# Patient Record
Sex: Male | Born: 1990
Health system: Southern US, Community
[De-identification: ages and names within clinical notes are randomized; demographics above are authoritative.]

---
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
## 2010-05-04 IMAGING — US US ABDOMEN COMPLETE
1 series · 14 of 25 positions shown · non-contrast
Comparison: 08/21/2006 renal ultrasound

CLINICAL DATA: Abnormal liver function tests.

ABDOMEN ULTRASOUND
TECHNIQUE: Complete abdominal ultrasound examination was performed
including evaluation of the liver, gallbladder, bile ducts,
pancreas, kidneys, spleen, IVC, and abdominal aorta.

[Series 1: us abdomen complete · 0.26mm/px · 14 of 66 slices shown]
[im 1/66]
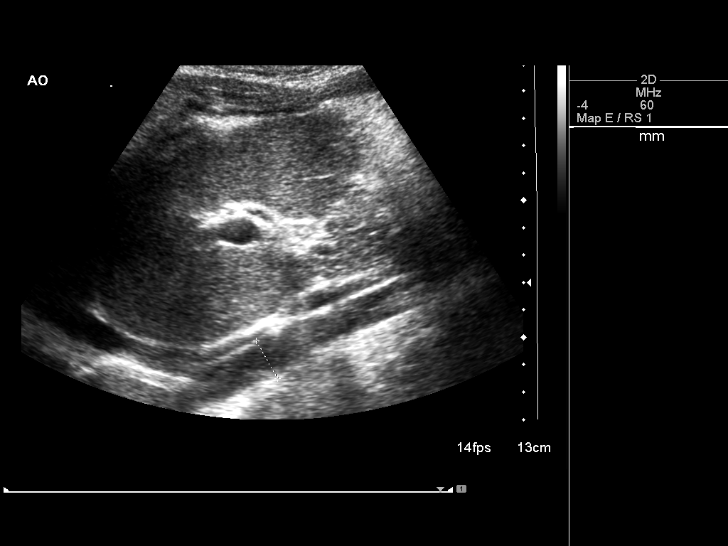
[im 6/66]
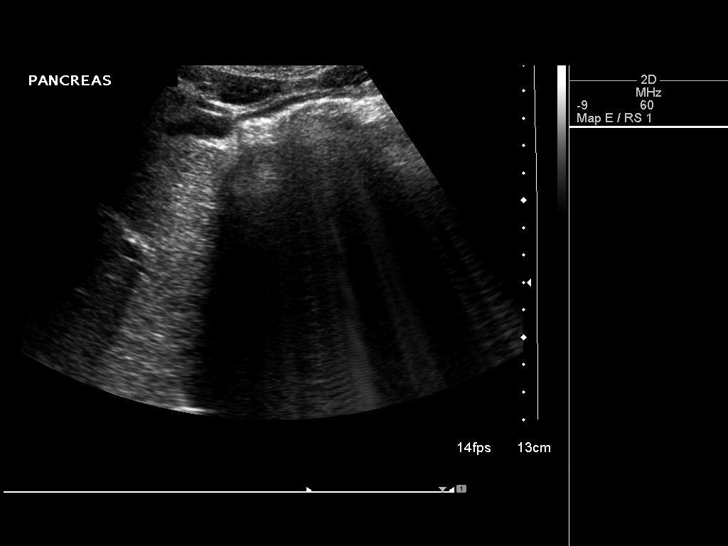
[im 11/66]
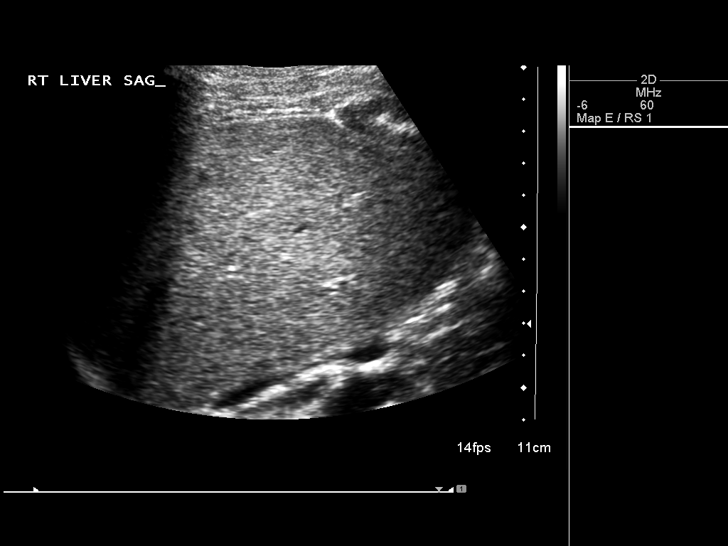
[im 17/66]
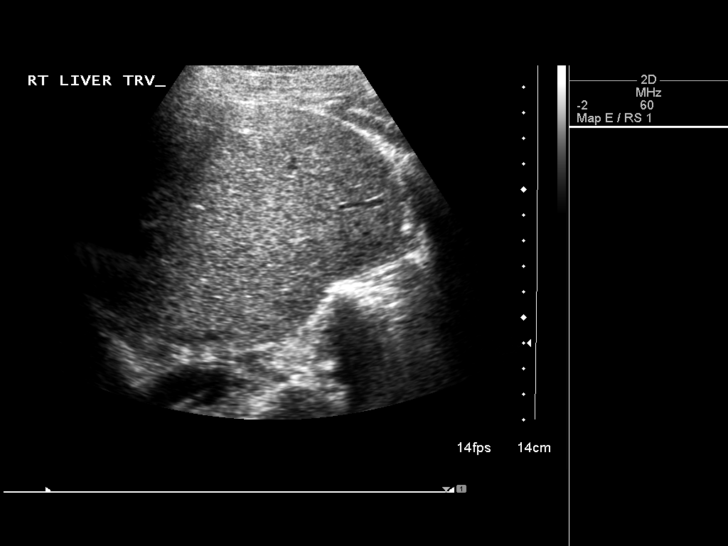
[im 22/66]
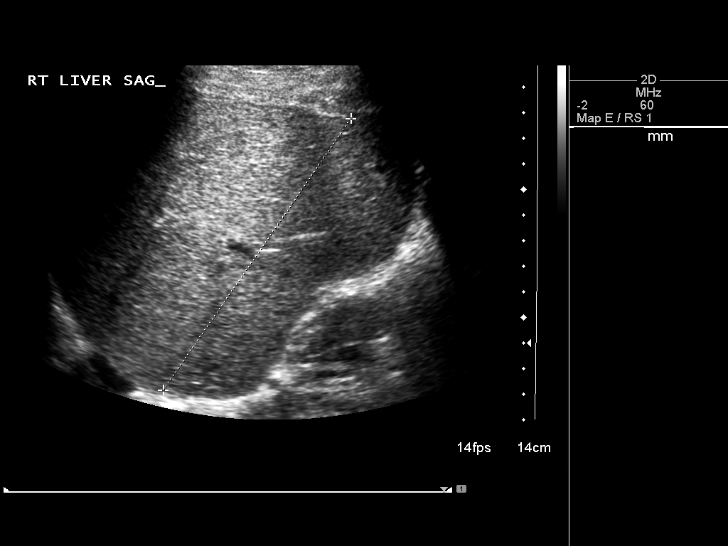
[im 25/66]
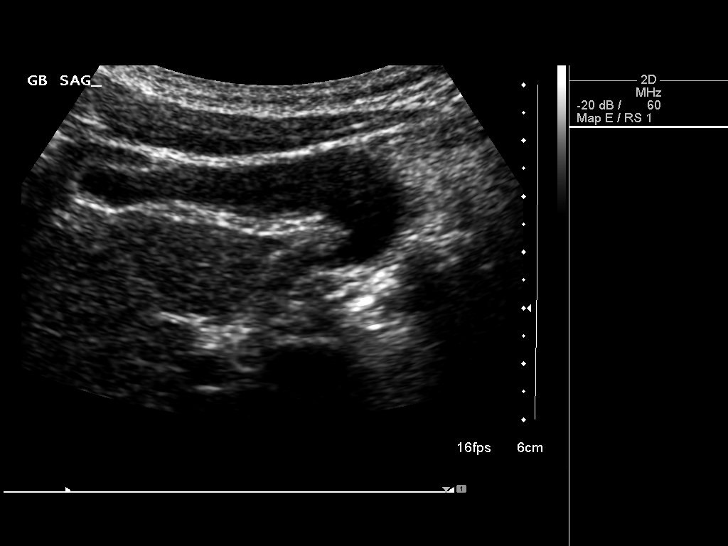
[im 30/66]
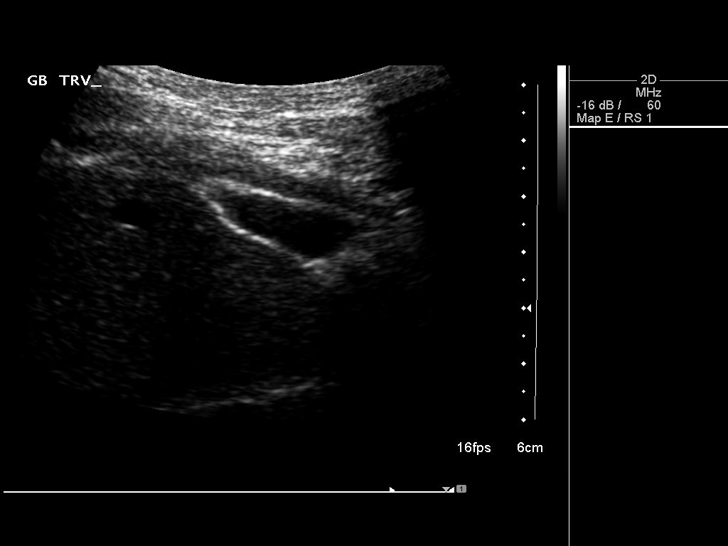
[im 36/66]
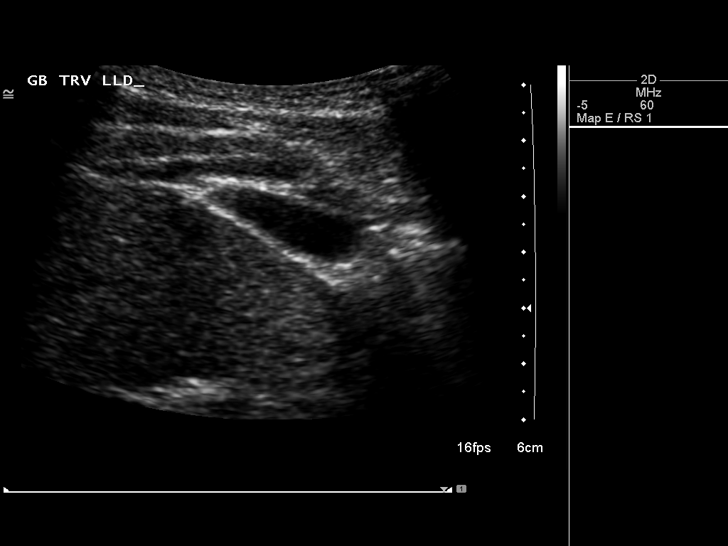
[im 41/66]
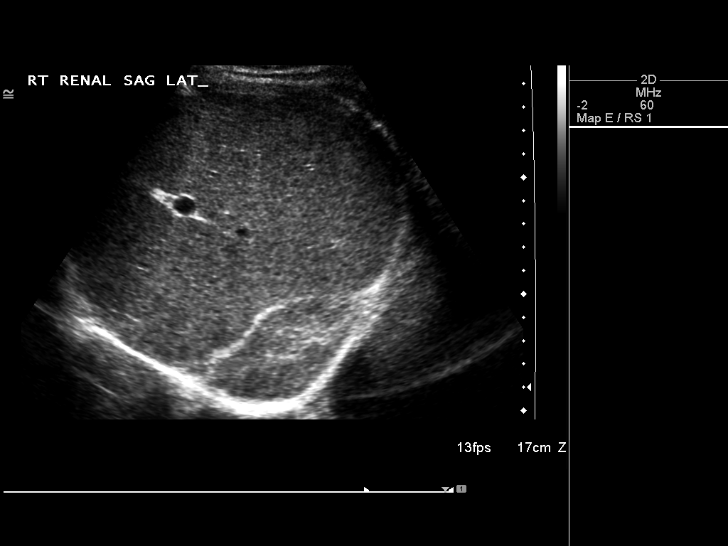
[im 44/66]
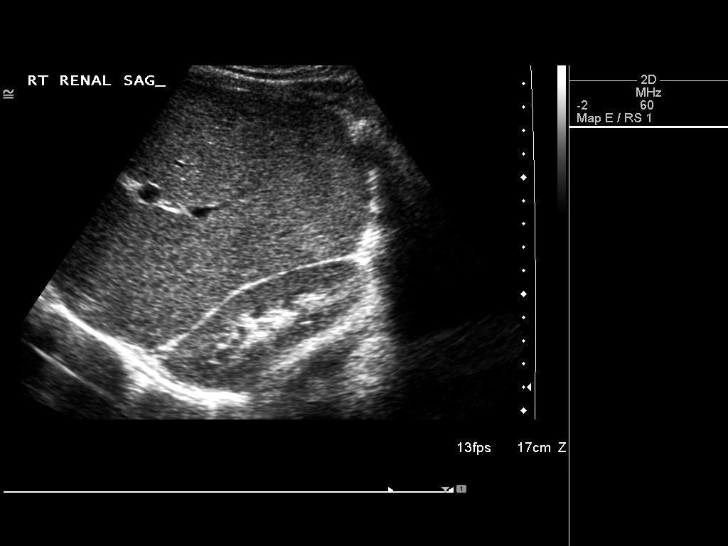
[im 49/66]
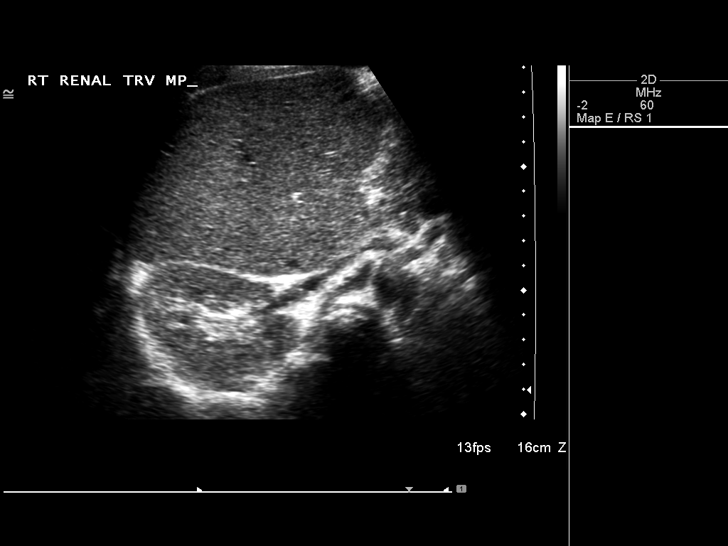
[im 55/66]
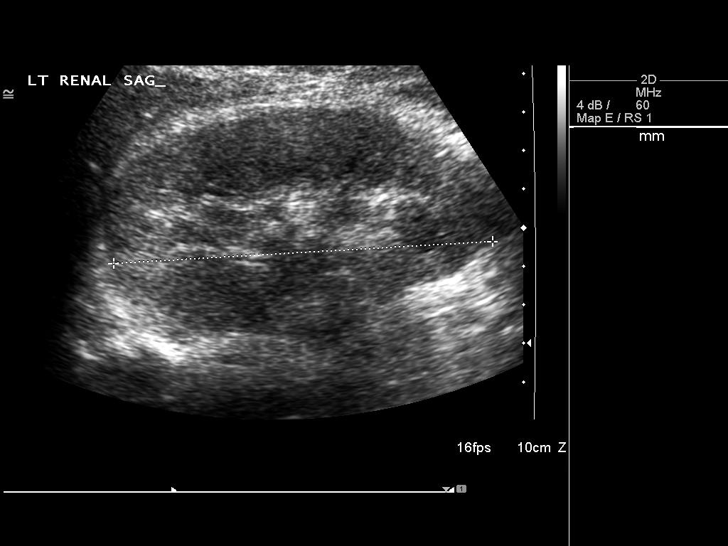
[im 60/66]
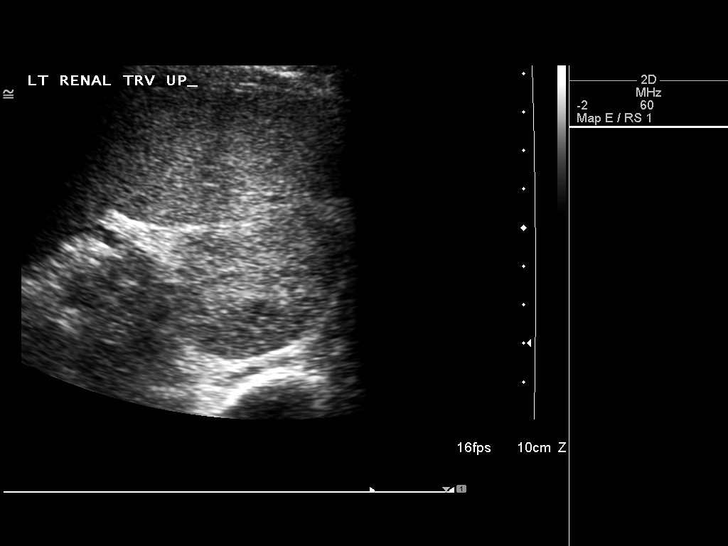
[im 66/66]
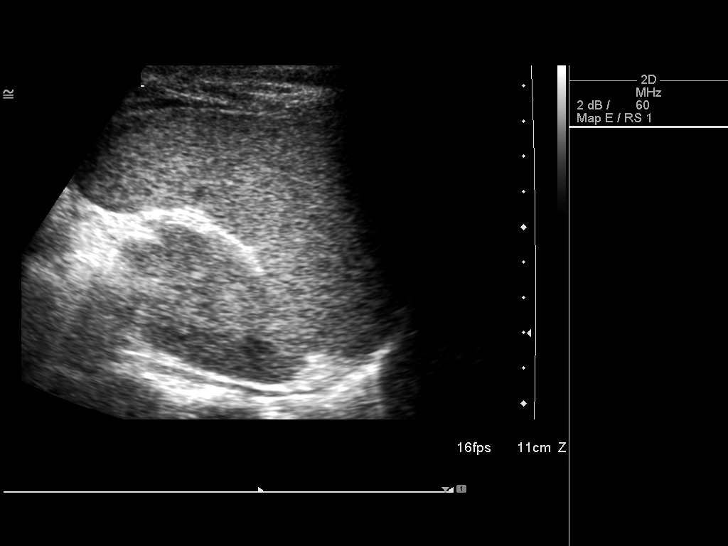

[14 of 25 positions shown; findings below may reference images not displayed]

FINDINGS: The gallbladder is adequately visualized and has a normal
appearance.  The common bile duct is normal in caliber measuring 3
mm in diameter.  The liver is normal size without focal
abnormality.  The inferior vena cava is patent.  The pancreas is
obscured by bowel gas.  The spleen has a normal appearance.  The
right kidney measures 9.7 cm in length and the left kidney 9.6 cm
in length.  There are no contour abnormalities or hydronephrosis.
The abdominal aorta has a normal appearance.
IMPRESSION: Normal study with incomplete visualization of pancreas due to
overlying gas.

## 2010-11-01 ENCOUNTER — Encounter: Payer: Self-pay | Admitting: *Deleted

## 2010-12-20 ENCOUNTER — Ambulatory Visit (INDEPENDENT_AMBULATORY_CARE_PROVIDER_SITE_OTHER): Payer: Managed Care, Other (non HMO) | Admitting: Pediatrics

## 2010-12-20 ENCOUNTER — Ambulatory Visit: Payer: Self-pay | Admitting: Pediatrics

## 2011-01-26 NOTE — H&P (Signed)
[place]. [facility]  Patient:    Mike [name], Mike [name] Visit Number: [id number] MRN: [id number]          Service Type: PED Location: PEDS [phone] Attending Physician:  McDiarmid, Leighton Roach. Dictated by:   Gwenlyn Perking, M.D. Admit Date:  09/26/2001 Discharge Date: 09/26/2001                           History and Physical  PRIMARY CARE PHYSICIAN: Bradly Bienenstock, M.D.  PROBLEM LIST:  1. Polyuria and polydipsia.  2. Enuresis.  3. Recent psychosocial stressor with move from [place] to the [place].  CHIEF COMPLAINT: Polyuria, polydipsia.  HISTORY OF PRESENT ILLNESS: The patient is a 20 year old [place] male, with a history of polyuria and polydipsia for the last month or so.  The child moved here from the [place] about four months ago and had no medical problems prior to onset of polyuria and polydipsia.  The patient gets up every one to 1-1/2 hours every night to drink.  The patient is also bed-wetting and incontinent at school on three occasions in the past.  Never previously had these problems.  Has had to wear diapers to school secondary to this incontinence.  His primary care physician gave a trial of DDAVP with decreased frequency of urination.  REVIEW OF SYSTEMS: Small for age, otherwise Review Of Systems negative.  PAST MEDICAL HISTORY: Negative.  MEDICATIONS: DDAVP used x2 days by Mom, instructed to stop 24 hours prior to admission.  ALLERGIES: No known drug allergies.  SOCIAL HISTORY: Moved here in October 2002 from the [place]  English as second language, improved rapidly but still difficult for him.  Mother and younger sister have been in this country already for about six years.  FAMILY HISTORY: No history of diabetes mellitus or other childhood illnesses.  PHYSICAL EXAMINATION:  VITAL SIGNS: Stable, afebrile.  GENERAL: Well-developed, well-nourished 20 year old male, awake and alert, in no apparent  distress.  HEENT: Normocephalic, atraumatic.  PERRLA.  EOMI.  Conjunctivae and lids without lesion.  Ears, nose, and throat grossly within normal limits.  NECK: No lymphadenopathy.  CARDIOVASCULAR: Regular rate and rhythm without murmurs.  RESPIRATORY: Clear to auscultation.  ABDOMEN: Soft, nontender, nondistended.  Normoactive bowel sounds.  EXTREMITIES: No clubbing, cyanosis, or edema.  LABORATORY DATA: BMET sodium 140, potassium 3.8, BUN 12, creatinine 0.6, glucose 96.  On admission he also actually had a CMET which revealed a normal electrolyte panel; however, his AST and ALT were elevated to greater than 200 each.  ASSESSMENT: This is a 20 year old male with sudden onset polyuria and polydipsia.  Initial laboratory tests negative for diabetes mellitus.  Concern for possibility of diabetes insipidus, particularly given good response to intranasal DDAVP.  However, this could also be psychogenic polydipsia given recent stress of moving to this country, which is a little bit less likely.  PLAN: Admission for 23 hour observation.  Water deprivation test.  Will measure serial serum as well as urine osmolarity.  If patient cannot concentrate urine likely diabetes insipidus.  Mother instructed to stop DDAVP as this could alter the test results.  If the patient has positive tests would CT or MRI of the head as pituitary lesion most likely etiology of this age group, nephrogenic diabetes insipidus less likely. Dictated by:   Gwenlyn Perking, M.D. Attending Physician:  McDiarmid, Tawanna Cooler D. DD:  09/26/01 TD:  09/27/01 Job: [id number]

## 2011-01-26 NOTE — Discharge Summary (Signed)
[place]. [facility]  Patient:    DAMAREA, MERKEL Visit Number: [id number] MRN: [id number]          Service Type: PED Location: PEDS [phone] Attending Physician:  McDiarmid, Leighton Roach. Dictated by:   Gwenlyn Perking, M.D. Admit Date:  09/26/2001 Discharge Date: 09/26/2001   CC:         Dr. Birder Robson                           Discharge Summary  ADMISSION DIAGNOSES: 1. Polyuria, polydipsia. 2. Recent psychosocial stressor with move from [place] to [place].  DISCHARGE DIAGNOSES: 1. Normal water deprevation test. 2. Psychosocial stressor as noted above.  CONSULTING PHYSICIANS:  None.  PROCEDURE:  Water deprevation test.  HISTORY OF PRESENT ILLNESS:  The patient is a 20 year old [place] male was admitted for polyuria, polydipsia, and recent onset of enuresis.  The patients physical examination on presentation was within normal limits. Vital signs were stable.  However, on admission examination he did have an increase in his AST and ALT to greater than 200.  Otherwise the other labs were within normal limits.  HOSPITAL COURSE:  #1 - Water deprevation test.  The patient was admitted for the purpose of a water deprevation test.  However, the mother had given him some DDAVP intranasally the night before despite instructions to the contrary.  However, we did call the pharmacy to check on the half-life of DDAVP which is about 75 minutes.  This should be well gone by the time this test was initiated at 10 oclock on the morning of admission.  On admission, his urine osmolarity was 441, his plasmal osmolarity was 271, his plasmal sodium was 136.  After one hour of water deprevation, the patients urine osmolarity increased to 558 and then increased again after another hour to 624.  At 1300 hours in the afternoon the urine osmolarity increased to 650 and at 1400 hours the urine osmolarity was to 646, thus demonstrating a normal urine  concentration increase with water deprevation.  An additional value was obtained which was 588, slightly decreased, however, the patient did receive a meal with salad at that point which could account for the slight drop in the urine osmolarity. The serum osmolarity did also increase slightly from baseline 274 to 286 after three hours, also demonstrating an appropriate increase in plasmal osmolarity to water deprevation.  #2 - The patient was found to have on admission H&P an AST of 223 and ALT 247. He is at somewhat high risk for hepatitis viruses coming from the [place].  I therefore, obtained an acute hepatitis panel which is still pending at the time of this dictation.  CONDITION ON DISCHARGE:  Stable.  DISPOSITION:  Discharge the patient to home.  DISCHARGE MEDICATIONS:  None.  Asked the patients mother to use DDAVP judiciously and discuss this medicine with her primary care physician at follow-up visit next week.  I have not provided her with a prescription for this medicine.  DISCHARGE INSTRUCTIONS:  Activity as tolerated.  Diet as tolerated.  I told the mother to restrict water about two hours before bedtime.  Otherwise instructed her to let Eoin seek water and drink water freely with some limitation.  Symptoms to warrant further treatment; should things get worse again with polyuria and polydipsia she is to come back to the [facility] for further evaluation and treatment.  FOLLOW-UP:  The patient is to follow  up with his primary care physician, Dr. Bess Kinds in one week or sooner as needed. Dictated by:   Gwenlyn Perking, M.D. Attending Physician:  McDiarmid, Tawanna Cooler D. DD:  09/26/01 TD:  09/29/01 Job: [id number]

## 2011-03-20 ENCOUNTER — Encounter: Payer: Self-pay | Admitting: Gastroenterology

## 2013-07-24 ENCOUNTER — Other Ambulatory Visit: Payer: Self-pay | Admitting: Gastroenterology

## 2013-07-31 ENCOUNTER — Other Ambulatory Visit: Payer: Managed Care, Other (non HMO)

## 2021-09-07 ENCOUNTER — Other Ambulatory Visit (HOSPITAL_BASED_OUTPATIENT_CLINIC_OR_DEPARTMENT_OTHER): Payer: Self-pay

## 2021-09-07 ENCOUNTER — Ambulatory Visit: Payer: Self-pay | Attending: Internal Medicine

## 2021-09-07 DIAGNOSIS — Z23 Encounter for immunization: Secondary | ICD-10-CM

## 2021-09-07 MED ORDER — MODERNA COVID-19 BIVAL BOOSTER 50 MCG/0.5ML IM SUSP
INTRAMUSCULAR | 0 refills | Status: AC
  Filled 2021-09-07: qty 0.5, 1d supply, fill #0

## 2021-09-07 NOTE — Progress Notes (Signed)
° °  Covid-19 Vaccination Clinic  Name:  Mike [name]    MRN: [id number] DOB: December 17, 1990  09/07/2021  [name] was observed post Covid-19 immunization for 15 minutes without incident. He was provided with Vaccine Information Sheet and instruction to access the V-Safe system.   [name] was instructed to call 911 with any severe reactions post vaccine: Difficulty breathing  Swelling of face and throat  A fast heartbeat  A bad rash all over body  Dizziness and weakness   Immunizations Administered     Name Date Dose VIS Date Route   Moderna Covid-19 vaccine Bivalent Booster 09/07/2021  3:53 PM 0.5 mL 04/22/2021 Intramuscular   Manufacturer: Gala Murdoch   Lot: [id number]   NDC: 53614-431-54

## 2023-04-25 ENCOUNTER — Emergency Department (HOSPITAL_BASED_OUTPATIENT_CLINIC_OR_DEPARTMENT_OTHER): Payer: Worker's Compensation

## 2023-04-25 ENCOUNTER — Other Ambulatory Visit: Payer: Self-pay

## 2023-04-25 ENCOUNTER — Emergency Department (HOSPITAL_COMMUNITY): Payer: Worker's Compensation | Admitting: Anesthesiology

## 2023-04-25 ENCOUNTER — Encounter (HOSPITAL_COMMUNITY)
Admission: EM | Disposition: A | Discharge status: Home / Self Care | Payer: Self-pay | Source: Home / Self Care | Attending: Emergency Medicine

## 2023-04-25 ENCOUNTER — Ambulatory Visit (HOSPITAL_BASED_OUTPATIENT_CLINIC_OR_DEPARTMENT_OTHER)
Admission: EM | Admit: 2023-04-25 | Discharge: 2023-04-26 | Disposition: A | Discharge status: Home / Self Care | Payer: Worker's Compensation | Attending: Emergency Medicine | Admitting: Emergency Medicine

## 2023-04-25 ENCOUNTER — Encounter (HOSPITAL_BASED_OUTPATIENT_CLINIC_OR_DEPARTMENT_OTHER): Payer: Self-pay | Admitting: Emergency Medicine

## 2023-04-25 ENCOUNTER — Emergency Department (HOSPITAL_BASED_OUTPATIENT_CLINIC_OR_DEPARTMENT_OTHER): Payer: Worker's Compensation | Admitting: Radiology

## 2023-04-25 DIAGNOSIS — S63054A Dislocation of other carpometacarpal joint of right hand, initial encounter: Secondary | ICD-10-CM | POA: Diagnosis not present

## 2023-04-25 DIAGNOSIS — W208XXA Other cause of strike by thrown, projected or falling object, initial encounter: Secondary | ICD-10-CM | POA: Insufficient documentation

## 2023-04-25 DIAGNOSIS — Y990 Civilian activity done for income or pay: Secondary | ICD-10-CM | POA: Insufficient documentation

## 2023-04-25 DIAGNOSIS — S6991XA Unspecified injury of right wrist, hand and finger(s), initial encounter: Secondary | ICD-10-CM | POA: Diagnosis present

## 2023-04-25 HISTORY — PX: FINGER CLOSED REDUCTION: SHX1633

## 2023-04-25 SURGERY — CLOSED REDUCTION, FRACTURE, METACARPAL BONE
Anesthesia: General | Site: Hand | Laterality: Right

## 2023-04-25 MED ORDER — PROPOFOL 10 MG/ML IV BOLUS
INTRAVENOUS | Status: DC | PRN
  Administered 2023-04-25: 200 mg via INTRAVENOUS

## 2023-04-25 MED ORDER — MIDAZOLAM HCL 5 MG/5ML IJ SOLN
INTRAMUSCULAR | Status: DC | PRN
  Administered 2023-04-25: 2 mg via INTRAVENOUS

## 2023-04-25 MED ORDER — FENTANYL CITRATE PF 50 MCG/ML IJ SOSY
50.0000 ug | PREFILLED_SYRINGE | Freq: Once | INTRAMUSCULAR | Status: AC
  Administered 2023-04-25: 50 ug via INTRAMUSCULAR
  Filled 2023-04-25: qty 1

## 2023-04-25 MED ORDER — ONDANSETRON HCL 4 MG/2ML IJ SOLN
INTRAMUSCULAR | Status: DC | PRN
  Administered 2023-04-25: 4 mg via INTRAVENOUS

## 2023-04-25 MED ORDER — FENTANYL CITRATE (PF) 250 MCG/5ML IJ SOLN
INTRAMUSCULAR | Status: DC | PRN
  Administered 2023-04-25: 100 ug via INTRAVENOUS
  Administered 2023-04-25: 50 ug via INTRAVENOUS

## 2023-04-25 MED ORDER — FENTANYL CITRATE (PF) 250 MCG/5ML IJ SOLN
INTRAMUSCULAR | Status: AC
  Filled 2023-04-25: qty 5

## 2023-04-25 MED ORDER — LACTATED RINGERS IV SOLN: INTRAVENOUS | Status: DC | PRN

## 2023-04-25 MED ORDER — LIDOCAINE HCL (CARDIAC) PF 100 MG/5ML IV SOSY
PREFILLED_SYRINGE | INTRAVENOUS | Status: DC | PRN
  Administered 2023-04-25: 60 mg via INTRATRACHEAL

## 2023-04-25 MED ORDER — BUPIVACAINE HCL (PF) 0.25 % IJ SOLN
INTRAMUSCULAR | Status: AC
  Filled 2023-04-25: qty 30

## 2023-04-25 MED ORDER — CEFAZOLIN SODIUM-DEXTROSE 2-3 GM-%(50ML) IV SOLR
INTRAVENOUS | Status: DC | PRN
  Administered 2023-04-25: 2 g via INTRAVENOUS

## 2023-04-25 MED ORDER — PROPOFOL 10 MG/ML IV BOLUS
INTRAVENOUS | Status: AC
  Filled 2023-04-25: qty 20

## 2023-04-25 MED ORDER — IBUPROFEN 400 MG PO TABS
600.0000 mg | ORAL_TABLET | Freq: Once | ORAL | Status: AC
  Administered 2023-04-25: 600 mg via ORAL
  Filled 2023-04-25: qty 1

## 2023-04-25 MED ORDER — MIDAZOLAM HCL 2 MG/2ML IJ SOLN
INTRAMUSCULAR | Status: AC
  Filled 2023-04-25: qty 2

## 2023-04-25 SURGICAL SUPPLY — 49 items
APL PRP STRL LF DISP 70% ISPRP (MISCELLANEOUS) ×1
BLADE SURG 15 STRL LF DISP TIS (BLADE) ×2 IMPLANT
BLADE SURG 15 STRL SS (BLADE) ×2
BNDG CMPR 5X3 KNIT ELC UNQ LF (GAUZE/BANDAGES/DRESSINGS) ×1
BNDG CMPR 9X4 STRL LF SNTH (GAUZE/BANDAGES/DRESSINGS) ×1
BNDG CMPR STD VLCR NS LF 5.8X4 (GAUZE/BANDAGES/DRESSINGS) ×1
BNDG ELASTIC 2X5.8 VLCR STR LF (GAUZE/BANDAGES/DRESSINGS) IMPLANT
BNDG ELASTIC 3INX 5YD STR LF (GAUZE/BANDAGES/DRESSINGS) ×1 IMPLANT
BNDG ELASTIC 4X5.8 VLCR NS LF (GAUZE/BANDAGES/DRESSINGS) IMPLANT
BNDG ESMARK 4X9 LF (GAUZE/BANDAGES/DRESSINGS) ×1 IMPLANT
BNDG GAUZE DERMACEA FLUFF 4 (GAUZE/BANDAGES/DRESSINGS) ×1 IMPLANT
BNDG GZE DERMACEA 4 6PLY (GAUZE/BANDAGES/DRESSINGS) ×1
CHLORAPREP W/TINT 26 (MISCELLANEOUS) ×1 IMPLANT
CORD BIPOLAR FORCEPS 12FT (ELECTRODE) IMPLANT
COVER BACK TABLE 60X90IN (DRAPES) ×1 IMPLANT
COVER MAYO STAND STRL (DRAPES) ×1 IMPLANT
CUFF TOURN SGL QUICK 18X4 (TOURNIQUET CUFF) ×1 IMPLANT
DRAPE EXTREMITY T 121X128X90 (DISPOSABLE) ×1 IMPLANT
DRAPE OEC MINIVIEW 54X84 (DRAPES) ×1 IMPLANT
DRAPE SURG 17X23 STRL (DRAPES) ×1 IMPLANT
GAUZE SPONGE 4X4 12PLY STRL (GAUZE/BANDAGES/DRESSINGS) ×1 IMPLANT
GAUZE XEROFORM 1X8 LF (GAUZE/BANDAGES/DRESSINGS) ×1 IMPLANT
GLOVE BIO SURGEON STRL SZ7.5 (GLOVE) ×1 IMPLANT
GLOVE BIOGEL PI IND STRL 8 (GLOVE) ×1 IMPLANT
GLOVE BIOGEL PI IND STRL 8.5 (GLOVE) IMPLANT
GLOVE SURG ORTHO 8.0 STRL STRW (GLOVE) IMPLANT
GOWN STRL REUS W/ TWL LRG LVL3 (GOWN DISPOSABLE) ×1 IMPLANT
GOWN STRL REUS W/TWL LRG LVL3 (GOWN DISPOSABLE) ×1
K-WIRE 0.9X102 (Wire) ×2 IMPLANT
KWIRE 0.9X102 (Wire) IMPLANT
NDL HYPO 25X1 1.5 SAFETY (NEEDLE) IMPLANT
NEEDLE HYPO 25X1 1.5 SAFETY (NEEDLE) IMPLANT
NS IRRIG 1000ML POUR BTL (IV SOLUTION) ×1 IMPLANT
PACK BASIN DAY SURGERY FS (CUSTOM PROCEDURE TRAY) ×1 IMPLANT
PAD CAST 3X4 CTTN HI CHSV (CAST SUPPLIES) IMPLANT
PAD CAST 4YDX4 CTTN HI CHSV (CAST SUPPLIES) IMPLANT
PADDING CAST ABS COTTON 3X4 (CAST SUPPLIES) IMPLANT
PADDING CAST COTTON 3X4 STRL (CAST SUPPLIES)
PADDING CAST COTTON 4X4 STRL (CAST SUPPLIES)
SLEEVE SCD COMPRESS KNEE MED (STOCKING) IMPLANT
SLING ARM FOAM STRAP LRG (SOFTGOODS) IMPLANT
SPLINT PLASTER CAST XFAST 3X15 (CAST SUPPLIES) IMPLANT
STOCKINETTE 4X48 STRL (DRAPES) ×1 IMPLANT
SUT ETHILON 3 0 PS 1 (SUTURE) IMPLANT
SUT ETHILON 4 0 PS 2 18 (SUTURE) IMPLANT
SYR BULB EAR ULCER 3OZ GRN STR (SYRINGE) IMPLANT
SYR CONTROL 10ML LL (SYRINGE) IMPLANT
TOWEL GREEN STERILE FF (TOWEL DISPOSABLE) ×2 IMPLANT
UNDERPAD 30X36 HEAVY ABSORB (UNDERPADS AND DIAPERS) ×1 IMPLANT

## 2023-04-25 NOTE — Anesthesia Preprocedure Evaluation (Incomplete)
Anesthesia Evaluation    Reviewed: Allergy & Precautions, Patient's Chart, lab work & pertinent test results, Unable to perform ROS - Chart review only  Airway        Dental   Pulmonary neg pulmonary ROS          Cardiovascular negative cardio ROS      Neuro/Psych negative neurological ROS     GI/Hepatic negative GI ROS, Neg liver ROS,,,  Endo/Other  negative endocrine ROS    Renal/GU negative Renal ROS     Musculoskeletal negative musculoskeletal ROS (+)    Abdominal   Peds  Hematology negative hematology ROS (+)   Anesthesia Other Findings   Reproductive/Obstetrics                              Anesthesia Physical Anesthesia Plan  ASA: 2  Anesthesia Plan: General   Post-op Pain Management: Ofirmev IV (intra-op)*   Induction: Intravenous, Rapid sequence and Cricoid pressure planned  PONV Risk Score and Plan: Ondansetron, Dexamethasone and Treatment may vary due to age or medical condition  Airway Management Planned: Oral ETT  Additional Equipment:   Intra-op Plan:   Post-operative Plan: Extubation in OR  Informed Consent:   Plan Discussed with:   Anesthesia Plan Comments:          Anesthesia Quick Evaluation

## 2023-04-25 NOTE — ED Provider Notes (Signed)
  Physical Exam  BP (!) 137/94 (BP Location: Left Arm)   Pulse (!) 51   Temp 99.2 F (37.3 C) (Oral)   Resp 16   Ht 5\' 1"  (1.549 m)   Wt 59 kg   SpO2 100%   BMI 24.56 kg/m   Physical Exam Vitals and nursing note reviewed.  Constitutional:      Appearance: Normal appearance.  Eyes:     General: No scleral icterus. Pulmonary:     Effort: Pulmonary effort is normal. No respiratory distress.  Musculoskeletal:        General: Tenderness and signs of injury present.     Comments: Swelling and bruising noted to the ulnar aspect of the right hand. Cap refill brisk. Compartments are soft. NV intact distally. Patient can still wiggle his fingers with pain.   Skin:    General: Skin is dry.  Neurological:     General: No focal deficit present.     Mental Status: He is alert. Mental status is at baseline.     Procedures  Procedures  ED Course / MDM   Clinical Course as of 04/25/23 2211  Thu Apr 25, 2023  2054 Awaiting call back from Dr. Merlyn Lot. [RR]    Clinical Course User Index [RR] Achille Rich, PA-C   Medical Decision Making Amount and/or Complexity of Data Reviewed Radiology: ordered.  Risk Prescription drug management.   Accepted handoff at shift change from [facility]. Please see prior provider note for more detail.   Briefly: Patient is 32 y.o. M presents to the ER for evaluation of right hand pain.   DDX: concern for abnormal CT findings.   Plan: Follow up on CT imaging.   CT imaging shows dislocation at the 4-5th carpo-metacarpol dislocation. Several bone fragments. Off the capitate.   I consulted Dr. Merlyn Lot who will evaluate the patient's imaging and get back to me.   Compartments are soft. Patient NV intact distally. Some bruising noted to the palmar aspect with swelling noted to the more ulnar aspect of the hand.   Dr. Merlyn Lot has him posted at the [facility] OR. Will do ER to ER transfer via private vehicle Dr. Suezanne Jacquet is the accepting physician.  Patient was educated on not eating or drinking anything. Will give him dose of pain medication before leaving. Family is aware to head straight to [facility] and to obey traffic laws and speed limit signs.      Achille Rich, PA-C 04/26/23 0981    Linwood Dibbles, MD 04/26/23 [phone]

## 2023-04-25 NOTE — ED Notes (Signed)
Report given to the next RN... 

## 2023-04-25 NOTE — ED Triage Notes (Signed)
Pt caox4, ambulatory c/o pain and swelling in R hand reporting "hitting his hand on a mixer at work" around approx 1pm. Obvious swelling to R hand with good sensation, pt states he is "unable to bend his fingers" in R hand.

## 2023-04-25 NOTE — Anesthesia Procedure Notes (Signed)
Procedure Name: LMA Insertion Date/Time: 04/25/2023 11:58 PM  Performed by: Claudina Lick, CRNAPre-anesthesia Checklist: Patient identified, Emergency Drugs available, Suction available and Patient being monitored Patient Re-evaluated:Patient Re-evaluated prior to induction Oxygen Delivery Method: Circle system utilized Preoxygenation: Pre-oxygenation with 100% oxygen Induction Type: IV induction Ventilation: Mask ventilation without difficulty LMA: LMA inserted LMA Size: 5.0 Tube type: Oral Number of attempts: 1 Placement Confirmation: positive ETCO2 and breath sounds checked- equal and bilateral Tube secured with: Tape Dental Injury: Teeth and Oropharynx as per pre-operative assessment

## 2023-04-25 NOTE — H&P (Signed)
Mike [name] is an 32 y.o. male.   Chief Complaint: Right hand injury HPI: 32 year old male present with mother.  He states he injured his right hand while at work today when a mixer bowl fell on it.  Seen at [facility] where radiographs were taken revealing dislocations of the ring and small finger CMC joints.  He reports no previous injury to the hand and no other injury at this time.  Allergies: No Known Allergies  History reviewed. No pertinent past medical history.  History reviewed. No pertinent surgical history.  Family History: History reviewed. No pertinent family history.  Social History:   reports that he has never smoked. He has never used smokeless tobacco. He reports that he does not drink alcohol and does not use drugs.  Medications: (Not in a hospital admission)   No results found for this or any previous visit (from the past 48 hour(s)).  CT Hand Right Wo Contrast  Result Date: 04/25/2023 CLINICAL DATA:  Hand trauma.  Pain EXAM: CT OF THE RIGHT HAND WITHOUT CONTRAST TECHNIQUE: Multidetector CT imaging of the right hand was performed according to the standard protocol. Multiplanar CT image reconstructions were also generated. RADIATION DOSE REDUCTION: This exam was performed according to the departmental dose-optimization program which includes automated exposure control, adjustment of the mA and/or kV according to patient size and/or use of iterative reconstruction technique. COMPARISON:  None Available. FINDINGS: Bones/Joint/Cartilage Preserved bone mineralization. There is dislocation of the fourth and fifth carpometacarpal joints. The metacarpals are displaced dorsally and there is some overriding to the distal carpal row osseous structures several tiny bone fragments are identified adjacent to the base of the fourth and fifth metacarpals as well which may be small bone fragments from fracture. There is also some fragments seen along the joint space of  the third carpometacarpal joint. Subtle donor sites of fracture are felt to be seen along the dorsal aspect of the capitate, possibly the hamate and trapezoid. This also possible donor site along the dorsal aspect of the base of the third, fourth and fifth metacarpals. Additional subtle areas of fracture are possible. There also some small bone fragments identified just dorsal to the second carpometacarpal joint. No additional fracture or dislocation identified. Joint effusion is seen about the wrist. Ligaments Suboptimally assessed by CT. Muscles and Tendons Grossly intact by CT scan. Evaluation is somewhat limited. If there is further concern additional soft tissue injury, MRI could be considered as clinically appropriate. Soft tissues Mild soft tissue edema about the proximal hand and wrist. No well-defined fluid collection or soft tissue gas. Critical Value/emergent results were called by telephone at the time of interpretation on 04/25/2023 at 3:50 pm to provider [facility] , who verbally acknowledged these results. IMPRESSION: Dislocation of the fourth and fifth carpometacarpal joints with displacement of the proximal aspect of the fourth and fifth metacarpals dorsally. Several fracture fragments are identified surrounding this area involving the second through fifth carpometacarpal joints with some visible donor sites along the distal carpal row bones and the base of the metacarpals. Some donor sites are not clearly seen. Soft tissue edema and joint effusion. Electronically Signed   By: Karen Kays M.D.   On: 04/25/2023 18:59   DG Hand Complete Right  Result Date: 04/25/2023 CLINICAL DATA:  Pain and swelling.  Trauma. EXAM: RIGHT HAND - COMPLETE 3 VIEW COMPARISON:  None Available. FINDINGS: Soft tissue swelling seen about the medial aspect of the hand at the level of the carpometacarpal  joints. On the lateral view there are some punctate densities dorsally to this level. Small fracture fragments are  possible. Question slight joint space widening as well of the fifth carpometacarpal joint on one view. Please correlate to point tenderness and if needed additional workup with CT as clinically appropriate. IMPRESSION: Soft tissue swelling seen about the medial aspect of the hand at the level of the carpometacarpal joints. There are some punctate densities dorsally to this level. Small fracture fragments are possible. Question slight joint space widening as well of the fifth carpometacarpal joint on one view. Please correlate to point tenderness and if needed additional workup with CT as clinically appropriate. Electronically Signed   By: Karen Kays M.D.   On: 04/25/2023 17:59      Blood pressure 129/83, pulse 66, temperature 99.2 F (37.3 C), temperature source Oral, resp. rate 18, height 5\' 1"  (1.549 m), weight 59 kg, SpO2 98%.  General appearance: alert, cooperative, and appears stated age Head: Normocephalic, without obvious abnormality, atraumatic Neck: supple, symmetrical, trachea midline Extremities: Intact sensation and capillary refill all digits.  +epl/fpl/io.  No wounds.  Swollen on the dorsum of the hand.  Tender to palpation at the base of the ring and small finger metacarpals.  Nontender at the base of the index and long finger metacarpals. Pulses: 2+ and symmetric Skin: Skin color, texture, turgor normal. 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  Recommend close reduction with pin fixation in the OR.  Risks, benefits and alternatives of surgery were discussed including risks of blood loss, infection, damage to nerves/vessels/tendons/ligament/bone, failure of surgery, need for additional surgery, complication with wound healing, stiffness, instability, posttraumatic arthritis.  He voiced understanding of these risks and elected to proceed.    Betha Loa 04/25/2023, 11:12 PM

## 2023-04-25 NOTE — ED Provider Notes (Signed)
[place] EMERGENCY DEPARTMENT AT [facility] Provider Note   CSN: [id number] Arrival date & time: 04/25/23  1555     History  Chief Complaint  Patient presents with   Hand Injury    Mike [name] is a 32 y.o. male.  Mike [name] is a 32 y.o. male is healthy, presents to the emergency department for evaluation of pain and swelling to the right hand.  He reports he hit the right hand on a large metal mixer at work around 1 PM.  Since then the hand has become increasingly painful and swollen.  Patient localizes pain primarily to the base of the fourth and fifth metacarpals where he has the no swelling he reports due to swelling and pain he has difficulty fully bending his fingers to make a fist.  He denies any numbness or tingling in the hand.  No other injuries.  Denies any wounds or bleeding when the injury initially occurred.  The history is provided by the patient.  Hand Injury Associated symptoms: no fever        Home Medications Prior to Admission medications   Medication Sig Start Date End Date Taking? Authorizing Provider  clindamycin-benzoyl peroxide (BENZACLIN) gel Apply to affected areas up to 2 times daily     [provider]  COVID-19 mRNA bivalent vaccine, Moderna, (MODERNA COVID-19 BIVAL BOOSTER) 50 MCG/0.5ML injection Inject into the muscle. 09/07/21   Judyann Munson, MD  desmopressin (DDAVP) 0.01 % SOLN 1 spray by Nasal route at bedtime.      [provider]      Allergies    Patient has no known allergies.    Review of Systems   Review of Systems  Constitutional:  Negative for chills and fever.  Musculoskeletal:  Positive for arthralgias.    Physical Exam Updated Vital Signs BP (!) 152/110 (BP Location: Left Arm)   Pulse 75   Temp 99.2 F (37.3 C) (Oral)   Resp (!) 22   Ht 5\' 1"  (1.549 m)   Wt 59 kg   SpO2 99%   BMI 24.56 kg/m  Physical Exam Vitals and nursing note reviewed.  Constitutional:       General: He is not in acute distress.    Appearance: Normal appearance. He is well-developed. He is not ill-appearing or diaphoretic.  HENT:     Head: Normocephalic and atraumatic.  Eyes:     General:        Right eye: No discharge.        Left eye: No discharge.  Pulmonary:     Effort: Pulmonary effort is normal. No respiratory distress.  Musculoskeletal:     Comments: Significant swelling to the right hand primarily over the fourth and fifth metacarpals with bony tenderness in this area, no obvious bony deformity palpated, there is a very small ecchymosis at the base of the fourth and fifth metacarpals on the palmar aspect of the hand, skin is intact with no evidence of wounds or lacerations.  Radial pulse and ulnar pulse 2+, cap refill intact in all fingers and sensation is intact.  Patient is able to push fingers apart and is able to flex and extend fingers to some extent but has difficulty fully making a fist due to pain and swelling. All compartments soft  Neurological:     Mental Status: He is alert and oriented to person, place, and time.     Coordination: Coordination normal.  Psychiatric:  Mood and Affect: Mood normal.        Behavior: Behavior normal.     ED Results / Procedures / Treatments   Labs (all labs ordered are listed, but only abnormal results are displayed) Labs Reviewed - No data to display  EKG None  Radiology   DG Hand Complete Right  Result Date: 04/25/2023 CLINICAL DATA:  Pain and swelling.  Trauma. EXAM: RIGHT HAND - COMPLETE 3 VIEW COMPARISON:  None Available. FINDINGS: Soft tissue swelling seen about the medial aspect of the hand at the level of the carpometacarpal joints. On the lateral view there are some punctate densities dorsally to this level. Small fracture fragments are possible. Question slight joint space widening as well of the fifth carpometacarpal joint on one view. Please correlate to point tenderness and if needed additional workup  with CT as clinically appropriate. IMPRESSION: Soft tissue swelling seen about the medial aspect of the hand at the level of the carpometacarpal joints. There are some punctate densities dorsally to this level. Small fracture fragments are possible. Question slight joint space widening as well of the fifth carpometacarpal joint on one view. Please correlate to point tenderness and if needed additional workup with CT as clinically appropriate. Electronically Signed   By: Karen Kays M.D.   On: 04/25/2023 17:59     Procedures Procedures    Medications Ordered in ED Medications  ibuprofen (ADVIL) tablet 600 mg (600 mg Oral Given 04/25/23 1702)    ED Course/ Medical Decision Making/ A&P                                Medical Decision Making Amount and/or Complexity of Data Reviewed Radiology: ordered.  Risk Prescription drug management.   32 year old male presents with right hand injury after striking his hand on a large metal mixer.  Significant amount of swelling noted at the base of the fourth and fifth metacarpals without bony tenderness in the wrist.  The hand is neurovascularly intact and compartments are soft.  Ibuprofen given for pain control and ice applied.  X-rays of the right hand obtained which show significant soft tissue swelling at the carpometacarpal joints, some punctate densities noted at the dorsal aspect of the base of the fourth and fifth metacarpals without obvious fracture, radiology recommends correlating with point tenderness and possibly following up with CT.  Given significant swelling in this area we will proceed with CT imaging of the hand due to continued concern for fracture, dislocation or subluxation at the carpometacarpal joint  At shift change care signed out to [facility] who will follow-up on pending CT imaging and disposition appropriately.        Final Clinical Impression(s) / ED Diagnoses Final diagnoses:  Right Hand Injury    Rx  / DC Orders ED Discharge Orders          Ordered    HYDROcodone-acetaminophen (NORCO) 5-325 MG tablet        04/26/23 0023              Dartha Lodge, PA-C 05/02/23 1610    Linwood Dibbles, MD 05/04/23 [phone]

## 2023-04-25 NOTE — Discharge Instructions (Addendum)
Please head straight to [facility] Emergency Department. The address is listed to this paperwork. Please make sure to obey traffic signs and laws.

## 2023-04-25 NOTE — Anesthesia Preprocedure Evaluation (Addendum)
Anesthesia Evaluation  Patient identified by MRN, date of birth, ID band Patient awake    Reviewed: Allergy & Precautions, NPO status , Patient's Chart, lab work & pertinent test results  Airway Mallampati: II  TM Distance: >3 FB Neck ROM: Full    Dental no notable dental hx.    Pulmonary neg pulmonary ROS   Pulmonary exam normal breath sounds clear to auscultation       Cardiovascular negative cardio ROS Normal cardiovascular exam Rhythm:Regular Rate:Normal     Neuro/Psych negative neurological ROS     GI/Hepatic negative GI ROS, Neg liver ROS,,,  Endo/Other  negative endocrine ROS    Renal/GU negative Renal ROS     Musculoskeletal negative musculoskeletal ROS (+)    Abdominal   Peds  Hematology negative hematology ROS (+)   Anesthesia Other Findings   Reproductive/Obstetrics                             Anesthesia Physical Anesthesia Plan  ASA: 2  Anesthesia Plan: General   Post-op Pain Management: Ofirmev IV (intra-op)*   Induction: Intravenous, Rapid sequence and Cricoid pressure planned  PONV Risk Score and Plan: 3 and Ondansetron, Dexamethasone, Treatment may vary due to age or medical condition and Midazolam  Airway Management Planned: LMA  Additional Equipment:   Intra-op Plan:   Post-operative Plan: Extubation in OR  Informed Consent: I have reviewed the patients History and Physical, chart, labs and discussed the procedure including the risks, benefits and alternatives for the proposed anesthesia with the patient or authorized representative who has indicated his/her understanding and acceptance.     Dental advisory given  Plan Discussed with: CRNA  Anesthesia Plan Comments:         Anesthesia Quick Evaluation

## 2023-04-25 NOTE — ED Notes (Signed)
Called patient 2x for updated vitals- no answer.

## 2023-04-25 NOTE — ED Notes (Signed)
Pt called for repeat vitals. No answer 

## 2023-04-26 ENCOUNTER — Encounter (HOSPITAL_COMMUNITY): Payer: Self-pay | Admitting: Orthopedic Surgery

## 2023-04-26 MED ORDER — AMISULPRIDE (ANTIEMETIC) 5 MG/2ML IV SOLN: 10.0000 mg | Freq: Once | INTRAVENOUS | Status: DC | PRN

## 2023-04-26 MED ORDER — BUPIVACAINE HCL (PF) 0.25 % IJ SOLN
INTRAMUSCULAR | Status: DC | PRN
  Administered 2023-04-26: 9 mL

## 2023-04-26 MED ORDER — 0.9 % SODIUM CHLORIDE (POUR BTL) OPTIME
TOPICAL | Status: DC | PRN
  Administered 2023-04-26: 1000 mL

## 2023-04-26 MED ORDER — ACETAMINOPHEN 10 MG/ML IV SOLN
INTRAVENOUS | Status: DC | PRN
  Administered 2023-04-25: 1000 mg via INTRAVENOUS

## 2023-04-26 MED ORDER — PROMETHAZINE HCL 25 MG/ML IJ SOLN: 6.2500 mg | INTRAMUSCULAR | Status: DC | PRN

## 2023-04-26 MED ORDER — OXYCODONE HCL 5 MG/5ML PO SOLN: 5.0000 mg | Freq: Once | ORAL | Status: DC | PRN

## 2023-04-26 MED ORDER — MEPERIDINE HCL 25 MG/ML IJ SOLN: 6.2500 mg | INTRAMUSCULAR | Status: DC | PRN

## 2023-04-26 MED ORDER — HYDROMORPHONE HCL 1 MG/ML IJ SOLN: 0.2500 mg | INTRAMUSCULAR | Status: DC | PRN

## 2023-04-26 MED ORDER — OXYCODONE HCL 5 MG PO TABS: 5.0000 mg | ORAL_TABLET | Freq: Once | ORAL | Status: DC | PRN

## 2023-04-26 MED ORDER — HYDROCODONE-ACETAMINOPHEN 5-325 MG PO TABS: ORAL_TABLET | ORAL | 0 refills | Status: AC

## 2023-04-26 NOTE — Op Note (Signed)
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  Closed reduction pin fixation right ring finger CMC dislocation 2.  Closed reduction pin fixation right small finger CMC dislocation   SURGEON:  Betha Loa, M.D.   ASSISTANT: none   ANESTHESIA:  General   INTRAVENOUS FLUIDS:  Per anesthesia flow sheet.   ESTIMATED BLOOD LOSS:  Minimal.   COMPLICATIONS:  None.   SPECIMENS:  none   TOURNIQUET TIME:   Right arm: 17 minutes at 250 mmHg   DISPOSITION:  Stable to PACU.   INDICATIONS: 32 year old male states he injured his right hand when a mixing bowl fell on it yesterday evening.  [facility] where [facility]ographs were taken revealing dislocations of the [facility] joints of the ring and small finger.  He was transferred to [facility] for further care.  Risks, benefits and alternatives of surgery were discussed including the risks of blood loss, infection, damage to nerves, vessels, tendons, ligaments, bone for surgery, need for additional surgery, complications with wound healing, continued pain, stiffness, instability, posttraumatic arthritis.  He voiced understanding of these risks and elected to proceed.  OPERATIVE COURSE:  After being identified preoperatively by myself,  the patient and I agreed on the procedure and site of the procedure.  The surgical site was marked.  Surgical consent had been signed. Preoperative IV antibiotic prophylaxis was given. He was transferred to the operating room and placed on the operating table in supine position with the Right upper extremity on an arm board.  General anesthesia was induced by the anesthesiologist.  Right upper  extremity was prepped and draped in normal sterile orthopedic fashion.  A surgical pause was performed between the surgeons, anesthesia, and operating room staff and all were in agreement as to the patient, procedure, and site of procedure.  Tourniquet at the proximal aspect of the extremity was inflated to 250 mmHg after exsanguination of the arm with an Esmarch bandage.  Close reduction of the right ring and small finger CMC dislocations was performed.  C-arm was used in AP and lateral projections to ensure appropriate reduction which was the case.  A 0.035 inch K wire was advanced in oblique fashion across the [facility] joint of the small finger.  An additional 0.035 inch K wire was advanced in an oblique fashion across the [facility] joint of the ring finger into the carpus.  This was adequate to stabilize the dislocations and good reduction.  C-arm was used in AP lateral and oblique projections to ensure appropriate reduction position of hardware as was the case.  The pins were bent and cut short.  The pin sites were injected with quarter percent plain Marcaine to aid in postoperative analgesia.  They were dressed with sterile Xeroform 4 x 4's and wrapped with a Kerlix bandage.  A volar and dorsal slab splint including the long ring and small fingers was placed with the MPs flexed and the IP's extended.  This was wrapped with Kerlix and Ace bandage.  The tourniquet was deflated at 17 minutes.  Fingertips were pink with brisk capillary refill after deflation of tourniquet.  The operative  drapes were broken down.  The patient was awoken from  anesthesia safely.  He was transferred back to the stretcher and taken to PACU in stable condition.  I will see him back in the office in 1 week for postoperative followup.  I will give him a prescription for Norco 5/325 1-2 tabs PO q6 hours prn pain, dispense # 20.   Betha Loa, MD Electronically signed, 04/26/23

## 2023-04-26 NOTE — Transfer of Care (Signed)
Immediate Anesthesia Transfer of Care Note  Patient: Mike [name]  Procedure(s) Performed: CLOSED REDUCTION  PINNING RIGHT HAND Carpometcarpal DISLOCATIONS (Right: Hand)  Patient Location: PACU  Anesthesia Type:General  Level of Consciousness: drowsy  Airway & Oxygen Therapy: Patient Spontanous Breathing  Post-op Assessment: Report given to RN and Post -op [name] signs reviewed and stable  Post [name] signs: Reviewed and stable  Last Vitals:  Vitals Value Taken Time  BP 130/95 04/26/23 0035  Temp    Pulse 109 04/26/23 0036  Resp 17 04/26/23 0036  SpO2 97 % 04/26/23 0036  Vitals shown include unfiled device data.  Last Pain:  Vitals:   04/25/23 1604  TempSrc: Oral  PainSc:          Complications: No notable events documented.

## 2023-04-29 NOTE — Anesthesia Postprocedure Evaluation (Signed)
Anesthesia Post Note  Patient: Mike [name]  Procedure(s) Performed: CLOSED REDUCTION  PINNING RIGHT HAND Carpometcarpal DISLOCATIONS (Right: Hand)     Patient location during evaluation: PACU Anesthesia Type: General Level of consciousness: sedated and patient cooperative Pain management: pain level controlled Vital Signs Assessment: post-procedure vital signs reviewed and stable Respiratory status: spontaneous breathing Cardiovascular status: stable Anesthetic complications: no   No notable events documented.  Last Vitals:  Vitals:   04/26/23 0045 04/26/23 0100  BP: 113/82 118/84  Pulse: 92 85  Resp: 14 15  Temp:    SpO2: 97% 98%    Last Pain:  Vitals:   04/26/23 0100  TempSrc:   PainSc: 0-No pain                 Lewie Loron
